# Patient Record
Sex: Female | Born: 1945 | Race: White | Hispanic: No | Smoking: Never smoker
Health system: Southern US, Community
[De-identification: ages and names within clinical notes are randomized; demographics above are authoritative.]

## PROBLEM LIST (undated history)

## (undated) DIAGNOSIS — R413 Other amnesia: Secondary | ICD-10-CM

## (undated) DIAGNOSIS — K5792 Diverticulitis of intestine, part unspecified, without perforation or abscess without bleeding: Secondary | ICD-10-CM

## (undated) HISTORY — DX: Other amnesia: R41.3

## (undated) HISTORY — DX: Diverticulitis of intestine, part unspecified, without perforation or abscess without bleeding: K57.92

---

## 1988-07-30 HISTORY — PX: ABDOMINAL HYSTERECTOMY: SHX81

## 2000-02-01 ENCOUNTER — Other Ambulatory Visit: Admission: RE | Admit: 2000-02-01 | Discharge: 2000-02-01 | Payer: Self-pay | Admitting: Obstetrics and Gynecology

## 2001-02-25 ENCOUNTER — Encounter (INDEPENDENT_AMBULATORY_CARE_PROVIDER_SITE_OTHER): Payer: Self-pay | Admitting: *Deleted

## 2001-02-25 ENCOUNTER — Ambulatory Visit (HOSPITAL_BASED_OUTPATIENT_CLINIC_OR_DEPARTMENT_OTHER): Admission: RE | Admit: 2001-02-25 | Discharge: 2001-02-25 | Payer: Self-pay | Admitting: Plastic Surgery

## 2007-01-07 ENCOUNTER — Other Ambulatory Visit: Admission: RE | Admit: 2007-01-07 | Discharge: 2007-01-07 | Payer: Self-pay | Admitting: Obstetrics & Gynecology

## 2007-04-24 ENCOUNTER — Observation Stay (HOSPITAL_COMMUNITY): Admission: EM | Admit: 2007-04-24 | Discharge: 2007-04-25 | Payer: Self-pay | Admitting: Emergency Medicine

## 2007-04-24 IMAGING — CT CT HEAD W/O CM
1 of 2 series · 16 of 30 positions shown, 20 images · IV contrast (agent unspecified)
Comparison: None.

CLINICAL DATA: Altered level of consciousness. 
 HEAD CT WITHOUT CONTRAST:
TECHNIQUE: Contiguous axial images were obtained from the base of the skull through the vertex 
 according to standard protocol without contrast.

[Series 2: head routine 4.8 h47s · axial · 0.39mm/px · z∈[-154,-30]mm · 16 of 30 slices shown, 20 images]
[im 2/30  brain]
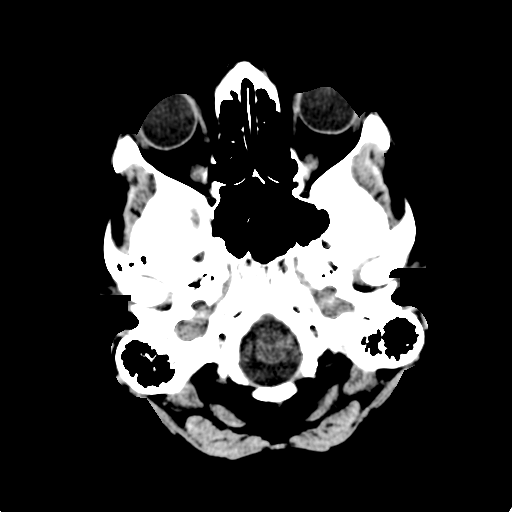
[im 2/30  bone]
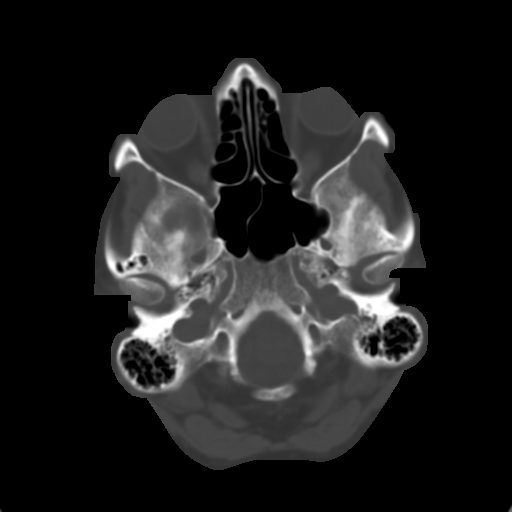
[im 4/30  brain]
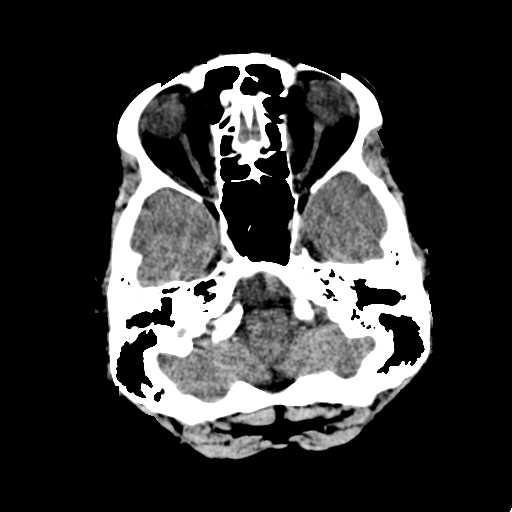
[im 5/30  brain]
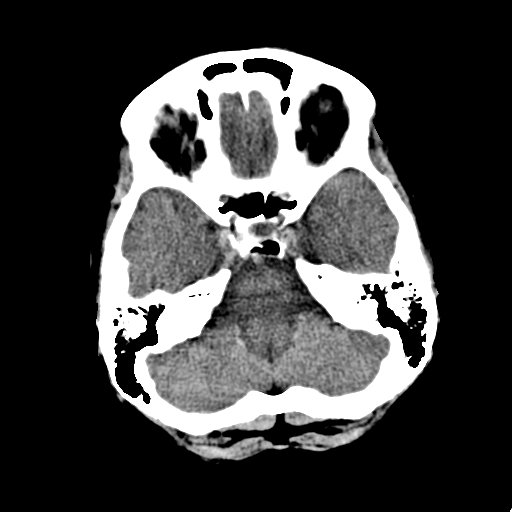
[im 7/30  brain]
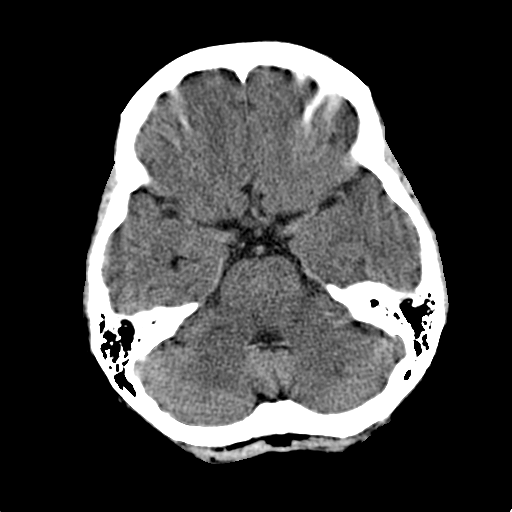
[im 9/30  brain]
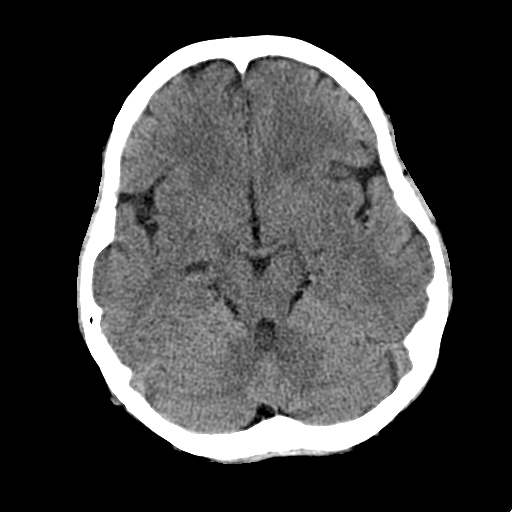
[im 9/30  bone]
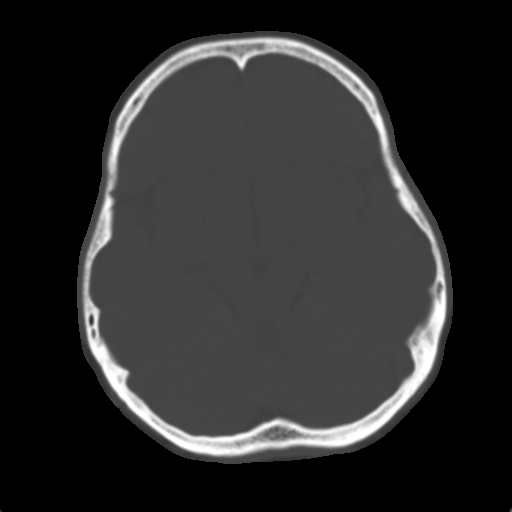
[im 10/30  brain]
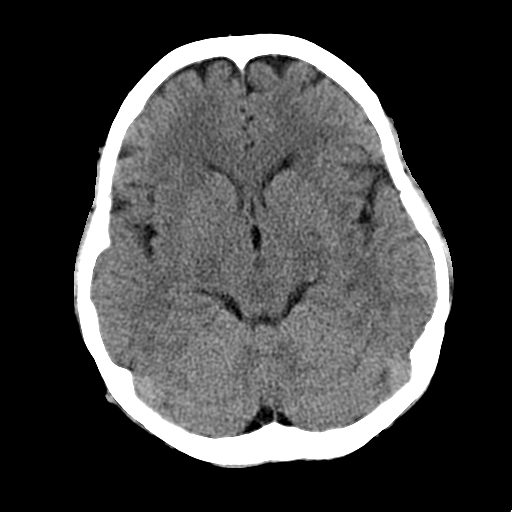
[im 12/30  brain]
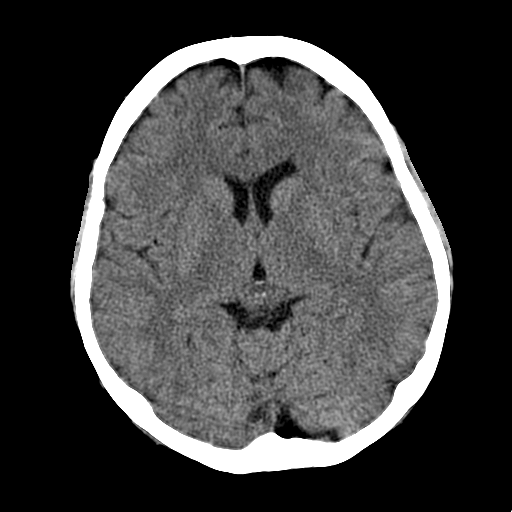
[im 13/30  brain]
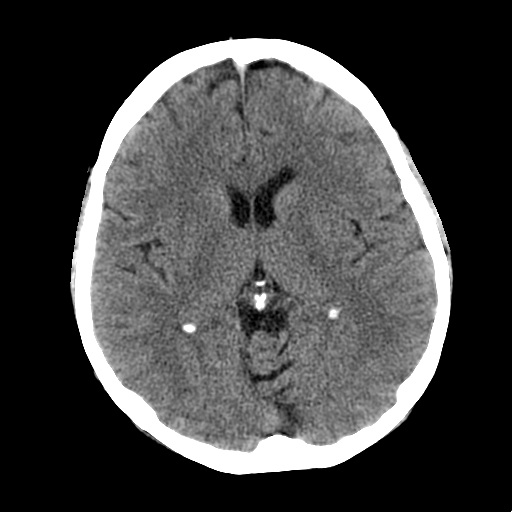
[im 17/30  brain]
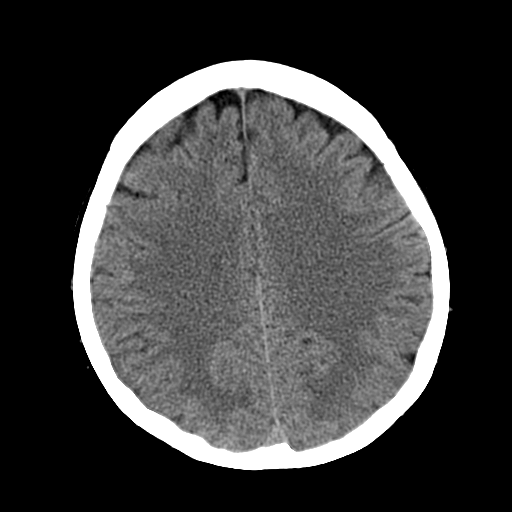
[im 17/30  bone]
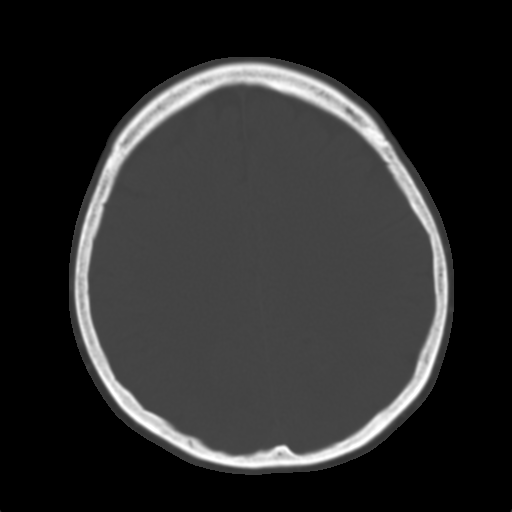
[im 18/30  brain]
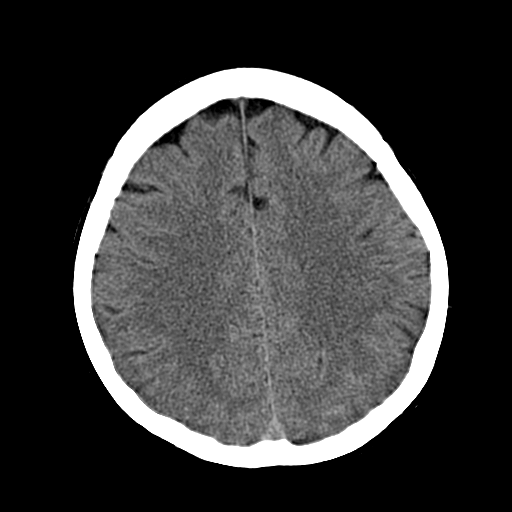
[im 20/30  brain]
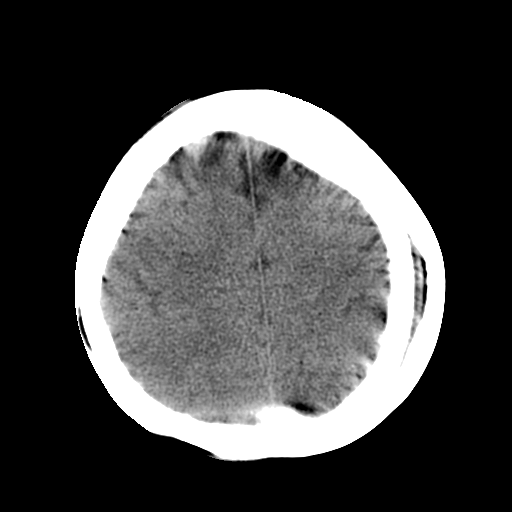
[im 21/30  brain]
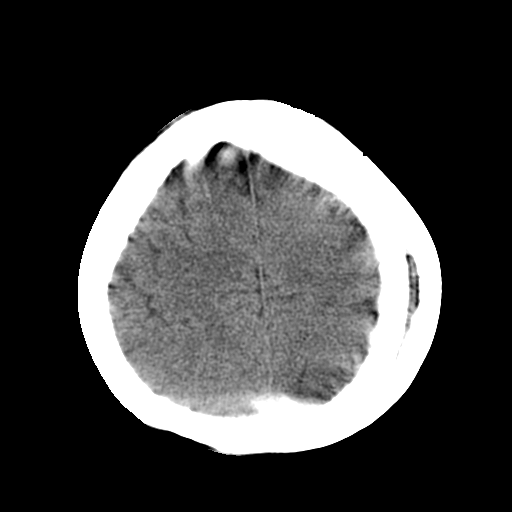
[im 23/30  brain]
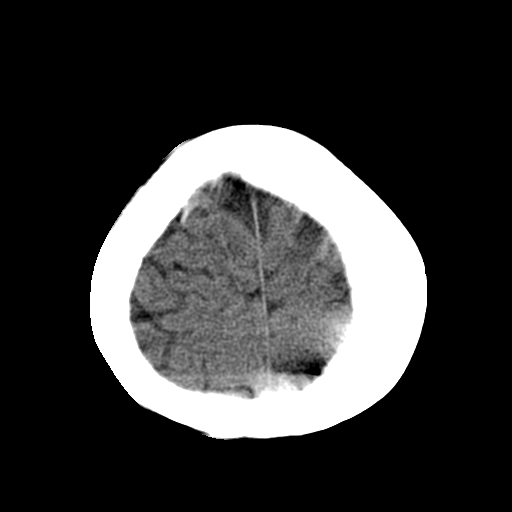
[im 23/30  bone]
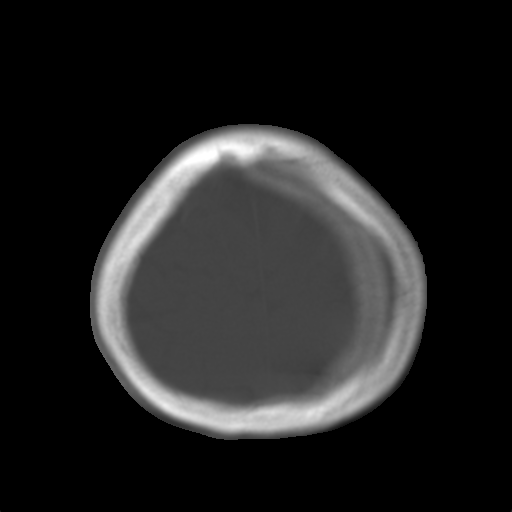
[im 25/30  brain]
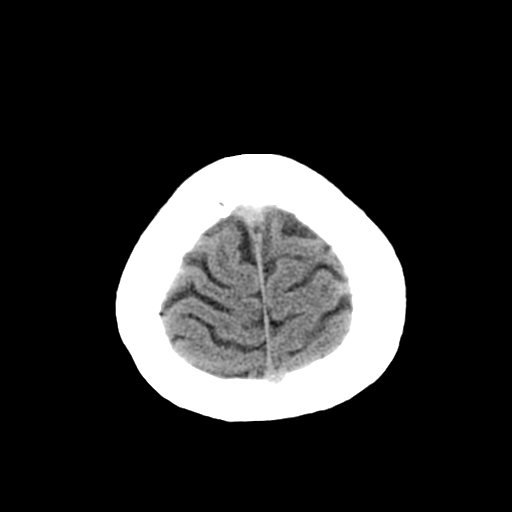
[im 26/30  brain]
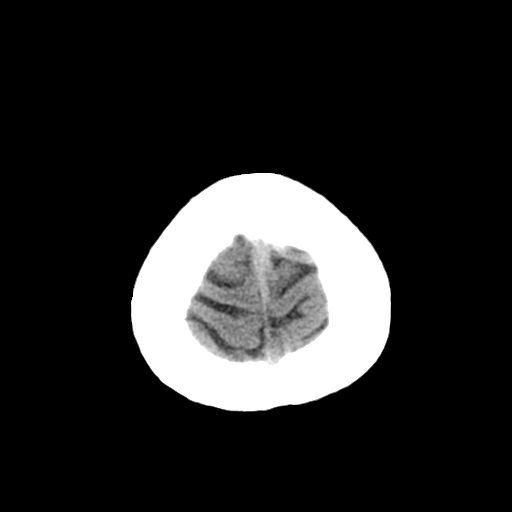
[im 28/30  brain]
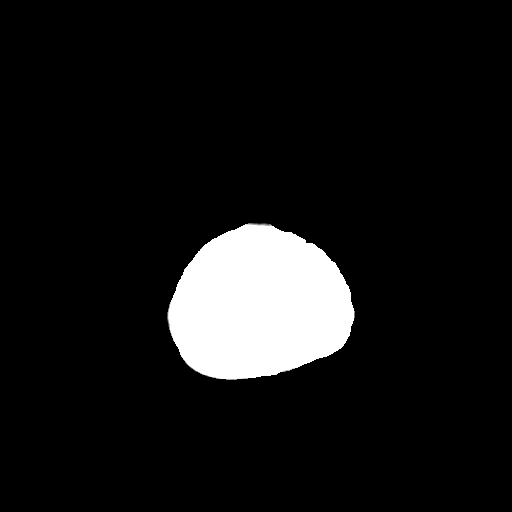

[16 of 30 positions shown; findings below may reference images not displayed]

FINDINGS: There is mild chronic microvascular ischemic change.  No evidence of intracranial abnormality including hemorrhage, infarct, mass, mass effect, midline shift or abnormal fluid collection.  There is no hydrocephalus.  The imaged paranasal sinuses and mastoid air cells are clear.
IMPRESSION: No acute intracranial abnormality.

## 2010-12-12 NOTE — H&P (Signed)
NAME:  Sally Mosley, Sally Mosley NO.:  [id number]   MEDICAL RECORD NO.:  [id number]          PATIENT TYPE:  EMS   LOCATION:  MAJO                         FACILITY:  [facility]   PHYSICIAN:  Larina Earthly, M.D.        DATE OF BIRTH:  01/01/1946   DATE OF ADMISSION:  04/24/2007  DATE OF DISCHARGE:                              HISTORY & PHYSICAL   CHIEF COMPLAINT:  Altered mental status with short-term memory loss at  [facility].   HISTORY OF PRESENT ILLNESS:  This is a 65 year old Caucasian female who  is relatively healthy with a past medical history significant for  hypertension, hyperlipidemia and osteopenia, who was at [facility]  this afternoon for the birth of her grandchild.  The birth was  complicated by a trapped umbilical cord, requiring emergency C-section.  The patient was found by her son-in-law after the birth of the  grandchild in the operating waiting room, in no apparent distress,  sitting in a chair but clearly with altered mental status, unable to  recognize the son-in-law, any other family or the situation.  She was  disoriented to place and time, as well.  EMS was called who assessed her  vital signs, which were normal.  She was found be afebrile.  Her oxygen  saturation was stable and normal on room air, and her glucose was  normal.  She was transported to [facility] emergency room, for further  evaluation and management.   In the emergency room, labs, drug screen, CT of the head, chest x-ray  and urinalysis were all within normal limits.  Cardiac enzymes were  normal.  Neurological and physical exam are all unremarkable, but the  patient continued to have disorientation to place and situation as well  as time and was unable to come up with appropriate words on occasion,  per the emergency room physician.  Upon my arrival, the patient was  slowly regaining her memory, specifically with a return to long-term.  She was able to slowly tell me what her  medical problems were, the fact  that she had 3 children and 4 and now 5 grandchildren and could tell me  that it was September 2008 but could not tell me the date or the day of  the week.  She could not name her medications at least initially, but  slowly came back with them over the next 5 to 10 minutes.  Clearly,  things were improving per the family's report, who were present during  the last 3 to 4 hours.  Again, in questioning both the family as well as  the patient, there is no history of seizure activity or seizure disorder  and no history of CNS abnormalities.  They did mention that stress may  be playing a role in the situation, given the fact that the patient's  third pregnancy was complicated by stillbirth at 9 months.  The patient  will now be admitted for further observation and possible MRI.  The  latter to look for any anatomic abnormalities missed by the head CT, as  well as  to entertain the differential diagnosis of possible seizure-like  activity.   REVIEW OF SYSTEMS:  Questionable for transient mild headache.   PROBLEM LIST:  1. Osteopenia.  2. Hypertension.  3. Hyperlipidemia.   MEDICATIONS:  As can best be ascertained, include benazepril. Evista,  Zocor, aspirin, vitamin D.   ALLERGIES:  SHE HAS NO KNOWN DRUG ALLERGIES.   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   FAMILY HISTORY:  Significant for hypertension, diabetes, but no seizure  or seizure activity or evidence of early strokes or heart disease.   LABS:  White blood cell count 8.4, hemoglobin 13.9, hematocrit 41.2%,  platelet count 231, sodium 138, potassium 3.7, carbon dioxide 23,  glucose 92, BUN 9, creatinine 0.73, calcium 9.1, total protein 6.7,  albumin 4.0, AST 28, ALT 29, alkaline phosphatase apparently normal,  alcohol level less than 5.  Urinalysis unremarkable for infection.   Drugs of abuse screen was negative.  Head CT revealed no acute  abnormality, and chest x-ray revealed no acute abnormality.   PHYSICAL EXAM:  GENERAL:  We have a pleasant, laughing Caucasian female,  lying in bed flat.  No apparent distress, questionably suspicious for  denial of the earlier events, and again she kept on asking how long she  had been in the emergency room.  She was alert to person, to the fact  that she was in the hospital but could not name the hospital, but could  name the hospitals were present in [place], and she was also alert to  month and year.  VITAL SIGNS:  Temperature is 97.7 degrees Fahrenheit, blood pressure  149/90, respirations 20, pulse 78 and regular.  Saturation 100% on room  air.  HEENT:  Sclerae anicteric.  Extraocular movements were intact.  The  pupils were equal and reactive to light and accommodation.  Face is  symmetric.  There is no oropharyngeal lesions.  There is no cervical  lymphadenopathy.  There is no axillary lymphadenopathy.  Tympanic  membranes were clear bilaterally.  LUNGS:  Clear to auscultation bilaterally.  CARDIOVASCULAR:  Regular rate and rhythm with no murmurs, rubs or  gallops appreciated.  ABDOMEN:  Soft, nontender, nondistended.  Bowel sounds are present.  There is hepatosplenomegaly.  There is no peripheral edema.  Pedal  pulses and radial pulses were intact and symmetrical.  NEUROLOGICAL:  Exam was abnormal for the disorientation, but cranial  nerves II-XII are grossly intact.  Strength was 5/5 in all 4  extremities.  DTRs were symmetrical.  Light touch was grossly intact in  all four extremities.  Gait was not tested.   ASSESSMENT PLAN:  1. Altered mental status with short-term memory loss and      disorientation of unclear etiology.  Differential diagnosis      includes possible postictal seizure activity, stress disorder, or      psychosomatic disorder secondary to the stressors involved with the      birth of her  grandchild, doubt stroke given the nonfocal      neurological exam and doubt metabolic toxicity given normal labs      and electrolytes, as well as negative tox screen.  Will admit the      patient for 23-hour observation, check a brain MRI to rule out mass      effect and/or acute stroke, and if the patient has recurrent  symptoms consider EEG to rule out seizure-like activity.  Will plan      on discharging on April 25, 2007 for further outpatient      followup, if events remained benign and MRI is unremarkable.  2. Hypertension.  Will continue ACE inhibitor.  3. Hyperlipidemia.  Will continue Statins.      Larina Earthly, M.D.  Electronically Signed     RA/MEDQ  D:  04/24/2007  T:  04/25/2007  Job:  [id number]   cc:   Gaspar Garbe, M.D.

## 2010-12-15 NOTE — Op Note (Signed)
[place]. [facility]  Patient:    KEYONNI, PERCIVAL                      MRN: [id number] Proc. Date: 02/25/01 Adm. Date:  [id number] Disc. Date: [id number] Attending:  Loura Halt Ii CC:         Hope M. Danella Deis, M.D.   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  OPERATION PERFORMED:  Excision of squamous cell carcinoma left popliteal area with primary closure.  SURGEON:  Alfredia Ferguson, M.D.  ANESTHESIA:  2% Xylocaine 1:100,000 epinephrine.  INDICATIONS FOR PROCEDURE:  The patient is a 65 year old woman who underwent a biopsy of a lesion in her left popliteal area by Dr. Campbell Stall.  The biopsy returned squamous cell carcinoma.  The patient returns today for a wider excision of the biopsy site.  She understands the risks of positive margins, infection, unsightly scarring and the possibility that all tumor has been resolved with the biopsy.  In spite of these and other risks discussed, she wishes to proceed with the surgery.  DESCRIPTION OF PROCEDURE:  After the patient was placed in the prone position, skin markers were placed in elliptical fashion around the lesion.  Local anesthesia was infiltrated.  The area was prepped and draped in sterile fashion.  An elliptical excision transversely oriented was carried out down to the level of subcutaneous tissues.  The lesion was passed off for pathology. Wound edges were undermined for a distance of several millimeters in all directions.  Hemostasis was accomplished using pressure.  The wound was closed by approximating the dermis using interrupted 4-0 Vicryl suture.  The skin was united using a running 5-0 nylon suture.  The patient tolerated the procedure well with minimal blood loss.  A light dressing was applied.  The patient  was discharged home in satisfactory condition. DD:  03/05/01 TD:  03/05/01 Job: [id number]

## 2011-05-10 LAB — COMPREHENSIVE METABOLIC PANEL
ALT: 29
AST: 28
Alkaline Phosphatase: 56
CO2: 23
Calcium: 9.1
Chloride: 106
GFR calc Af Amer: >60
GFR calc non Af Amer: >60
Potassium: 3.7
Sodium: 138
Total Bilirubin: 0.9

## 2011-05-10 LAB — ETHANOL: Alcohol, Ethyl (B): <5

## 2011-05-10 LAB — DIFFERENTIAL
Basophils Relative: 0
Eosinophils Absolute: 0.1
Eosinophils Relative: 1
Lymphs Abs: 1.3

## 2011-05-10 LAB — CULTURE, BLOOD (ROUTINE X 2): Culture: NO GROWTH

## 2011-05-10 LAB — URINE MICROSCOPIC-ADD ON

## 2011-05-10 LAB — URINALYSIS, ROUTINE W REFLEX MICROSCOPIC
Glucose, UA: NEGATIVE
Ketones, ur: 15 — AB
Leukocytes, UA: NEGATIVE
pH: 6

## 2011-05-10 LAB — CBC
Hemoglobin: 13.9
RBC: 4.56
WBC: 8.4

## 2011-05-10 LAB — POCT CARDIAC MARKERS: Operator id: 270111

## 2013-03-04 ENCOUNTER — Ambulatory Visit: Payer: Self-pay | Admitting: Internal Medicine

## 2017-05-28 ENCOUNTER — Other Ambulatory Visit: Payer: Self-pay | Admitting: Internal Medicine

## 2017-05-28 DIAGNOSIS — K5730 Diverticulosis of large intestine without perforation or abscess without bleeding: Secondary | ICD-10-CM

## 2017-05-28 DIAGNOSIS — R1032 Left lower quadrant pain: Secondary | ICD-10-CM

## 2017-05-31 ENCOUNTER — Ambulatory Visit
Admission: RE | Admit: 2017-05-31 | Discharge: 2017-05-31 | Disposition: A | Discharge status: Home / Self Care | Payer: Medicare Other | Source: Ambulatory Visit | Attending: Internal Medicine | Admitting: Internal Medicine

## 2017-05-31 DIAGNOSIS — K5730 Diverticulosis of large intestine without perforation or abscess without bleeding: Secondary | ICD-10-CM

## 2017-05-31 DIAGNOSIS — R1032 Left lower quadrant pain: Secondary | ICD-10-CM

## 2017-05-31 MED ORDER — IOPAMIDOL (ISOVUE-300) INJECTION 61%
100.0000 mL | Freq: Once | INTRAVENOUS | Status: AC | PRN
  Administered 2017-05-31: 100 mL via INTRAVENOUS

## 2018-05-05 DIAGNOSIS — Z86010 Personal history of colonic polyps: Secondary | ICD-10-CM | POA: Insufficient documentation

## 2018-09-12 ENCOUNTER — Ambulatory Visit: Payer: Medicare Other | Admitting: Allergy

## 2018-09-12 ENCOUNTER — Encounter: Payer: Self-pay | Admitting: Allergy

## 2018-09-12 VITALS — BP 110/80 | HR 123 | Resp 16 | Ht 59.0 in | Wt 129.4 lb

## 2018-09-12 DIAGNOSIS — T50905D Adverse effect of unspecified drugs, medicaments and biological substances, subsequent encounter: Secondary | ICD-10-CM | POA: Diagnosis not present

## 2018-09-12 NOTE — Progress Notes (Signed)
New Patient Note  RE: Sally Mosley MRN: [id number] DOB: July 15, 1946 Date of Office Visit: 09/12/2018  Referring provider: No ref. provider found Primary care provider: Tisovec, Adelfa Koh, MD  Chief Complaint: drug allergy  History of present illness: Sally Mosley is a 73 y.o. female presenting today for evaluation of drug allergy.    She states she wants to find out if she is allergic to PCN and sulfa drugs.  She states she has a history of diverticulitis and had a flare recently and needed to be treated with 2 different antibiotics.  She states Augmentin was a preferred option however she carried a PCN allergy label thus was not able to take this.       She states she doesn't remember having a reaction to PCN or if she ever took it.  She has been avoiding PCNs since childhood.    She vaguely remembers the sulfa reaction as a child.  She believes she may have developed an itchy rash but is unsure when rash started during the course.  She also does not recall having any blistering or skin sloughing or mucosal involvement.    She denies any history of asthma, eczema, food allergy or allergic rhinitis/conjunctivitis.    Review of systems: Review of Systems  Constitutional: Negative for chills, fever and malaise/fatigue.  HENT: Negative for congestion, ear discharge, nosebleeds and sore throat.   Eyes: Negative for pain, discharge and redness.  Respiratory: Negative for cough, shortness of breath and wheezing.   Cardiovascular: Negative for chest pain.  Gastrointestinal: Negative for abdominal pain, constipation, diarrhea, heartburn, nausea and vomiting.  Musculoskeletal: Negative for joint pain.  Skin: Negative for itching and rash.  Neurological: Negative for headaches.    All other systems negative unless noted above in HPI  Past medical history: Past Medical History:  Diagnosis Date  . Amnesia memory loss   . Diverticulitis     Past surgical  history: Past Surgical History:  Procedure Laterality Date  . ABDOMINAL HYSTERECTOMY  1990    Family history:  Family History  Problem Relation Age of Onset  . Diabetes Mellitus II Sister     Social history: Lives ina home with carpeting with gas heating and central cooling.  Dog in the home.  No concern for water damage, mildew or roaches in the home.  She is a retired Runner, broadcasting/film/video.  No smoking history.   Medication List: Allergies as of 09/12/2018      Reactions   Sulfamethoxazole Itching      Medication List       Accurate as of September 12, 2018 12:02 PM. Always use your most recent med list.        benazepril 10 MG tablet Commonly known as:  LOTENSIN Take by mouth.   hyoscyamine 0.375 MG 12 hr tablet Commonly known as:  LEVBID   hyoscyamine 0.125 MG SL tablet Commonly known as:  LEVSIN SL Take by mouth.   pravastatin 20 MG tablet Commonly known as:  PRAVACHOL Take by mouth.   Vitamin D3 25 MCG (1000 UT) Caps Take by mouth.       Known medication allergies: Allergies  Allergen Reactions  . Sulfamethoxazole Itching    Physical examination: Blood pressure 110/80, pulse (!) 123, resp. rate 16, height 4\' 11"  (1.499 m), weight 129 lb 6.4 oz (58.7 kg), SpO2 94 %.  General: Alert, interactive, in no acute distress. HEENT: PERRLA, TMs pearly gray, turbinates non-edematous without discharge, post-pharynx unremarkable. Neck: Supple  without lymphadenopathy. Lungs: Clear to auscultation without wheezing, rhonchi or rales. {no increased work of breathing. CV: Normal S1, S2 without murmurs. Abdomen: Nondistended, nontender. Skin: Warm and dry, without lesions or rashes. Extremities:  No clubbing, cyanosis or edema. Neuro:   Grossly intact.  Diagnositics/Labs: Drug challenge to PCN with use of PCN V 250mg /59ml solution. Benefits and risks of challenge discussed and verbal consent from mother obtained.  She was provided with increasing doses of PCN at 1%, 10% and  90% doses of total dose of 500mg .  Doses given 20 minutes apart.  She was observed for additional hour after completion of ingestion challenge.  She had no signs/symptoms of allergic reaction.  Vitals were obtained prior to discharge and remained stable.  Discharge vitals 122/86, HR 89, RR 16.   Assessment and plan:   Adverse drug reaction  - Previous history of Penicillin ingestion/reaction unknown.  Given this decision made to perform in-office challenge to PCN V with total dose of 500mg .  Entirety of the dose was tolerated today and no adverse events occurred.  Thus you are not allergic to Penicillin based medications.  Ok to use Penicillins in future if needed.  We will remove from allergy profile.    - Previous history of itchy rash after sulfa drug use.  This reaction does not sound like a serious drug reaction where continued avoidance would be required.  Thus would recommend in-office challenge to Bactrim be performed in the future to prove you are not allergic.    Follow-up for Bactrim challenge.  Once appt has been scheduled we will fill a prescription for Bactrim to bring to the office.  Hold all antihistamines for at least 3 days prior to challenge visit.    I appreciate the opportunity to take part in Sally Mosley's care. Please do not hesitate to contact me with questions.  Sincerely,   Margo Aye, MD Allergy/Immunology [facility]

## 2018-09-12 NOTE — Patient Instructions (Signed)
Drug allergy   - Previous history of Penicillin ingestion/reaction unknown.  Given this decision made to perform in-office challenge to PCN V with total dose of 500mg .  Entirety of the dose was tolerated today and no adverse events occurred.  Thus you are not allergic to Penicillin based medications.  Ok to use Penicillins in future if needed.  We will remove from allergy profile.    - Previous history of itchy rash after sulfa drug use.  This reaction does not sound like a serious drug reaction where continued avoidance would be required.  Thus would recommend in-office challenge to Bactrim be performed in the future to prove you are not allergic.    Follow-up for Bactrim challenge.  Once appt has been scheduled we will fill a prescription for Bactrim to bring to the office.  Hold all antihistamines for at least 3 days prior to challenge visit.

## 2019-08-29 ENCOUNTER — Ambulatory Visit: Payer: Medicare Other

## 2019-09-04 ENCOUNTER — Ambulatory Visit: Payer: Medicare PPO | Attending: Internal Medicine

## 2019-09-04 DIAGNOSIS — Z23 Encounter for immunization: Secondary | ICD-10-CM

## 2019-09-04 NOTE — Progress Notes (Signed)
° °  Covid-19 Vaccination Clinic  Name:  Sally Mosley    MRN: [id number] DOB: Dec 24, 1945  09/04/2019  [name] was observed post Covid-19 immunization for 15 minutes without incidence. She was provided with Vaccine Information Sheet and instruction to access the V-Safe system.   [name] was instructed to call 911 with any severe reactions post vaccine:  Difficulty breathing   Swelling of your face and throat   A fast heartbeat   A bad rash all over your body   Dizziness and weakness    Immunizations Administered    Name Date Dose VIS Date Route   Pfizer COVID-19 Vaccine 09/04/2019  9:03 AM 0.3 mL 07/10/2019 Intramuscular   Manufacturer: [employer], [employer]   Lot: [id number]   NDC: [id number]

## 2019-09-09 ENCOUNTER — Ambulatory Visit: Payer: Medicare Other

## 2019-09-29 ENCOUNTER — Ambulatory Visit: Payer: Medicare PPO | Attending: Internal Medicine

## 2019-09-29 DIAGNOSIS — Z23 Encounter for immunization: Secondary | ICD-10-CM | POA: Insufficient documentation

## 2019-09-29 NOTE — Progress Notes (Signed)
   Covid-19 Vaccination Clinic  Name:  Sally Mosley    MRN: [id number] DOB: 1946/02/06  09/29/2019  [name] was observed post Covid-19 immunization for 15 minutes without incident. She was provided with Vaccine Information Sheet and instruction to access the V-Safe system.   [name] was instructed to call 911 with any severe reactions post vaccine: Marland Kitchen Difficulty breathing  . Swelling of face and throat  . A fast heartbeat  . A bad rash all over body  . Dizziness and weakness   Immunizations Administered    Name Date Dose VIS Date Route   Pfizer COVID-19 Vaccine 09/29/2019  9:33 AM 0.3 mL 07/10/2019 Intramuscular   Manufacturer: [employer], [employer]   Lot: [id number]   NDC: [address]-1

## 2019-12-08 DIAGNOSIS — D692 Other nonthrombocytopenic purpura: Secondary | ICD-10-CM | POA: Diagnosis not present

## 2019-12-08 DIAGNOSIS — D1801 Hemangioma of skin and subcutaneous tissue: Secondary | ICD-10-CM | POA: Diagnosis not present

## 2019-12-08 DIAGNOSIS — L820 Inflamed seborrheic keratosis: Secondary | ICD-10-CM | POA: Diagnosis not present

## 2019-12-08 DIAGNOSIS — L821 Other seborrheic keratosis: Secondary | ICD-10-CM | POA: Diagnosis not present

## 2019-12-08 DIAGNOSIS — Z85828 Personal history of other malignant neoplasm of skin: Secondary | ICD-10-CM | POA: Diagnosis not present

## 2019-12-08 DIAGNOSIS — L723 Sebaceous cyst: Secondary | ICD-10-CM | POA: Diagnosis not present

## 2020-02-09 DIAGNOSIS — H2513 Age-related nuclear cataract, bilateral: Secondary | ICD-10-CM | POA: Diagnosis not present

## 2020-02-09 DIAGNOSIS — H43813 Vitreous degeneration, bilateral: Secondary | ICD-10-CM | POA: Diagnosis not present

## 2020-02-09 DIAGNOSIS — H5010 Unspecified exotropia: Secondary | ICD-10-CM | POA: Diagnosis not present

## 2020-02-09 DIAGNOSIS — H04123 Dry eye syndrome of bilateral lacrimal glands: Secondary | ICD-10-CM | POA: Diagnosis not present

## 2020-02-19 DIAGNOSIS — Z0000 Encounter for general adult medical examination without abnormal findings: Secondary | ICD-10-CM | POA: Diagnosis not present

## 2020-02-19 DIAGNOSIS — E7800 Pure hypercholesterolemia, unspecified: Secondary | ICD-10-CM | POA: Diagnosis not present

## 2020-02-19 DIAGNOSIS — M859 Disorder of bone density and structure, unspecified: Secondary | ICD-10-CM | POA: Diagnosis not present

## 2020-02-19 DIAGNOSIS — R7301 Impaired fasting glucose: Secondary | ICD-10-CM | POA: Diagnosis not present

## 2020-02-19 DIAGNOSIS — I10 Essential (primary) hypertension: Secondary | ICD-10-CM | POA: Diagnosis not present

## 2020-02-26 DIAGNOSIS — D126 Benign neoplasm of colon, unspecified: Secondary | ICD-10-CM | POA: Diagnosis not present

## 2020-02-26 DIAGNOSIS — E7800 Pure hypercholesterolemia, unspecified: Secondary | ICD-10-CM | POA: Diagnosis not present

## 2020-02-26 DIAGNOSIS — I10 Essential (primary) hypertension: Secondary | ICD-10-CM | POA: Diagnosis not present

## 2020-02-26 DIAGNOSIS — J309 Allergic rhinitis, unspecified: Secondary | ICD-10-CM | POA: Diagnosis not present

## 2020-02-26 DIAGNOSIS — Z1331 Encounter for screening for depression: Secondary | ICD-10-CM | POA: Diagnosis not present

## 2020-02-26 DIAGNOSIS — R82998 Other abnormal findings in urine: Secondary | ICD-10-CM | POA: Diagnosis not present

## 2020-02-26 DIAGNOSIS — Z0000 Encounter for general adult medical examination without abnormal findings: Secondary | ICD-10-CM | POA: Diagnosis not present

## 2020-02-26 DIAGNOSIS — D692 Other nonthrombocytopenic purpura: Secondary | ICD-10-CM | POA: Diagnosis not present

## 2020-02-26 DIAGNOSIS — R7301 Impaired fasting glucose: Secondary | ICD-10-CM | POA: Diagnosis not present

## 2020-02-26 DIAGNOSIS — M8580 Other specified disorders of bone density and structure, unspecified site: Secondary | ICD-10-CM | POA: Diagnosis not present

## 2020-03-03 DIAGNOSIS — Z1212 Encounter for screening for malignant neoplasm of rectum: Secondary | ICD-10-CM | POA: Diagnosis not present

## 2020-04-12 DIAGNOSIS — H43813 Vitreous degeneration, bilateral: Secondary | ICD-10-CM | POA: Diagnosis not present

## 2020-04-12 DIAGNOSIS — H2513 Age-related nuclear cataract, bilateral: Secondary | ICD-10-CM | POA: Diagnosis not present

## 2020-04-12 DIAGNOSIS — H40023 Open angle with borderline findings, high risk, bilateral: Secondary | ICD-10-CM | POA: Diagnosis not present

## 2020-04-12 DIAGNOSIS — H04201 Unspecified epiphora, right lacrimal gland: Secondary | ICD-10-CM | POA: Diagnosis not present

## 2020-04-12 DIAGNOSIS — H5010 Unspecified exotropia: Secondary | ICD-10-CM | POA: Diagnosis not present

## 2020-05-28 DIAGNOSIS — Z23 Encounter for immunization: Secondary | ICD-10-CM | POA: Diagnosis not present

## 2020-08-23 DIAGNOSIS — Z1231 Encounter for screening mammogram for malignant neoplasm of breast: Secondary | ICD-10-CM | POA: Diagnosis not present

## 2020-10-17 DIAGNOSIS — R7301 Impaired fasting glucose: Secondary | ICD-10-CM | POA: Diagnosis not present

## 2020-10-17 DIAGNOSIS — M542 Cervicalgia: Secondary | ICD-10-CM | POA: Diagnosis not present

## 2020-10-17 DIAGNOSIS — R5381 Other malaise: Secondary | ICD-10-CM | POA: Diagnosis not present

## 2020-10-17 DIAGNOSIS — I10 Essential (primary) hypertension: Secondary | ICD-10-CM | POA: Diagnosis not present

## 2020-12-21 DIAGNOSIS — L570 Actinic keratosis: Secondary | ICD-10-CM | POA: Diagnosis not present

## 2020-12-21 DIAGNOSIS — L821 Other seborrheic keratosis: Secondary | ICD-10-CM | POA: Diagnosis not present

## 2020-12-21 DIAGNOSIS — L438 Other lichen planus: Secondary | ICD-10-CM | POA: Diagnosis not present

## 2021-02-24 DIAGNOSIS — H353131 Nonexudative age-related macular degeneration, bilateral, early dry stage: Secondary | ICD-10-CM | POA: Diagnosis not present

## 2021-02-24 DIAGNOSIS — H43813 Vitreous degeneration, bilateral: Secondary | ICD-10-CM | POA: Diagnosis not present

## 2021-02-24 DIAGNOSIS — H04201 Unspecified epiphora, right lacrimal gland: Secondary | ICD-10-CM | POA: Diagnosis not present

## 2021-02-24 DIAGNOSIS — H40023 Open angle with borderline findings, high risk, bilateral: Secondary | ICD-10-CM | POA: Diagnosis not present

## 2021-02-24 DIAGNOSIS — H2513 Age-related nuclear cataract, bilateral: Secondary | ICD-10-CM | POA: Diagnosis not present

## 2021-02-27 DIAGNOSIS — R7301 Impaired fasting glucose: Secondary | ICD-10-CM | POA: Diagnosis not present

## 2021-02-27 DIAGNOSIS — M859 Disorder of bone density and structure, unspecified: Secondary | ICD-10-CM | POA: Diagnosis not present

## 2021-02-27 DIAGNOSIS — E7800 Pure hypercholesterolemia, unspecified: Secondary | ICD-10-CM | POA: Diagnosis not present

## 2021-03-01 DIAGNOSIS — M8580 Other specified disorders of bone density and structure, unspecified site: Secondary | ICD-10-CM | POA: Diagnosis not present

## 2021-03-01 DIAGNOSIS — D692 Other nonthrombocytopenic purpura: Secondary | ICD-10-CM | POA: Diagnosis not present

## 2021-03-01 DIAGNOSIS — K648 Other hemorrhoids: Secondary | ICD-10-CM | POA: Diagnosis not present

## 2021-03-01 DIAGNOSIS — R82998 Other abnormal findings in urine: Secondary | ICD-10-CM | POA: Diagnosis not present

## 2021-03-01 DIAGNOSIS — B351 Tinea unguium: Secondary | ICD-10-CM | POA: Diagnosis not present

## 2021-03-01 DIAGNOSIS — R7301 Impaired fasting glucose: Secondary | ICD-10-CM | POA: Diagnosis not present

## 2021-03-01 DIAGNOSIS — I10 Essential (primary) hypertension: Secondary | ICD-10-CM | POA: Diagnosis not present

## 2021-03-01 DIAGNOSIS — Z0000 Encounter for general adult medical examination without abnormal findings: Secondary | ICD-10-CM | POA: Diagnosis not present

## 2021-03-01 DIAGNOSIS — E7800 Pure hypercholesterolemia, unspecified: Secondary | ICD-10-CM | POA: Diagnosis not present

## 2021-03-01 DIAGNOSIS — Z1331 Encounter for screening for depression: Secondary | ICD-10-CM | POA: Diagnosis not present

## 2021-03-03 DIAGNOSIS — H04562 Stenosis of left lacrimal punctum: Secondary | ICD-10-CM | POA: Diagnosis not present

## 2021-03-03 DIAGNOSIS — H04223 Epiphora due to insufficient drainage, bilateral lacrimal glands: Secondary | ICD-10-CM | POA: Diagnosis not present

## 2021-03-03 DIAGNOSIS — H02831 Dermatochalasis of right upper eyelid: Secondary | ICD-10-CM | POA: Diagnosis not present

## 2021-03-03 DIAGNOSIS — H57813 Brow ptosis, bilateral: Secondary | ICD-10-CM | POA: Diagnosis not present

## 2021-03-03 DIAGNOSIS — H02834 Dermatochalasis of left upper eyelid: Secondary | ICD-10-CM | POA: Diagnosis not present

## 2021-03-03 DIAGNOSIS — H04553 Acquired stenosis of bilateral nasolacrimal duct: Secondary | ICD-10-CM | POA: Diagnosis not present

## 2021-03-23 DIAGNOSIS — M8589 Other specified disorders of bone density and structure, multiple sites: Secondary | ICD-10-CM | POA: Diagnosis not present

## 2021-05-02 DIAGNOSIS — H04411 Chronic dacryocystitis of right lacrimal passage: Secondary | ICD-10-CM | POA: Diagnosis not present

## 2021-05-02 DIAGNOSIS — H04221 Epiphora due to insufficient drainage, right lacrimal gland: Secondary | ICD-10-CM | POA: Diagnosis not present

## 2021-05-02 DIAGNOSIS — H04223 Epiphora due to insufficient drainage, bilateral lacrimal glands: Secondary | ICD-10-CM | POA: Diagnosis not present

## 2021-05-02 DIAGNOSIS — H57813 Brow ptosis, bilateral: Secondary | ICD-10-CM | POA: Diagnosis not present

## 2021-05-02 DIAGNOSIS — H02831 Dermatochalasis of right upper eyelid: Secondary | ICD-10-CM | POA: Diagnosis not present

## 2021-05-02 DIAGNOSIS — H04551 Acquired stenosis of right nasolacrimal duct: Secondary | ICD-10-CM | POA: Diagnosis not present

## 2021-05-02 DIAGNOSIS — H04021 Chronic dacryoadenitis, right lacrimal gland: Secondary | ICD-10-CM | POA: Diagnosis not present

## 2021-05-02 DIAGNOSIS — H02834 Dermatochalasis of left upper eyelid: Secondary | ICD-10-CM | POA: Diagnosis not present

## 2021-05-02 DIAGNOSIS — H04561 Stenosis of right lacrimal punctum: Secondary | ICD-10-CM | POA: Diagnosis not present

## 2021-05-02 DIAGNOSIS — H04553 Acquired stenosis of bilateral nasolacrimal duct: Secondary | ICD-10-CM | POA: Diagnosis not present

## 2021-05-02 DIAGNOSIS — H04562 Stenosis of left lacrimal punctum: Secondary | ICD-10-CM | POA: Diagnosis not present

## 2021-05-16 DIAGNOSIS — H57813 Brow ptosis, bilateral: Secondary | ICD-10-CM | POA: Diagnosis not present

## 2021-05-16 DIAGNOSIS — H04562 Stenosis of left lacrimal punctum: Secondary | ICD-10-CM | POA: Diagnosis not present

## 2021-05-16 DIAGNOSIS — H04559 Acquired stenosis of unspecified nasolacrimal duct: Secondary | ICD-10-CM | POA: Diagnosis not present

## 2021-05-16 DIAGNOSIS — H02831 Dermatochalasis of right upper eyelid: Secondary | ICD-10-CM | POA: Diagnosis not present

## 2021-05-16 DIAGNOSIS — H02834 Dermatochalasis of left upper eyelid: Secondary | ICD-10-CM | POA: Diagnosis not present

## 2021-05-16 DIAGNOSIS — H04221 Epiphora due to insufficient drainage, right lacrimal gland: Secondary | ICD-10-CM | POA: Diagnosis not present

## 2021-06-21 DIAGNOSIS — L820 Inflamed seborrheic keratosis: Secondary | ICD-10-CM | POA: Diagnosis not present

## 2021-06-21 DIAGNOSIS — D692 Other nonthrombocytopenic purpura: Secondary | ICD-10-CM | POA: Diagnosis not present

## 2021-06-21 DIAGNOSIS — L821 Other seborrheic keratosis: Secondary | ICD-10-CM | POA: Diagnosis not present

## 2021-06-21 DIAGNOSIS — L814 Other melanin hyperpigmentation: Secondary | ICD-10-CM | POA: Diagnosis not present

## 2021-06-21 DIAGNOSIS — L720 Epidermal cyst: Secondary | ICD-10-CM | POA: Diagnosis not present

## 2021-06-29 DIAGNOSIS — H02831 Dermatochalasis of right upper eyelid: Secondary | ICD-10-CM | POA: Diagnosis not present

## 2021-06-29 DIAGNOSIS — H04551 Acquired stenosis of right nasolacrimal duct: Secondary | ICD-10-CM | POA: Diagnosis not present

## 2021-06-29 DIAGNOSIS — H04221 Epiphora due to insufficient drainage, right lacrimal gland: Secondary | ICD-10-CM | POA: Diagnosis not present

## 2021-06-29 DIAGNOSIS — H57813 Brow ptosis, bilateral: Secondary | ICD-10-CM | POA: Diagnosis not present

## 2021-06-29 DIAGNOSIS — H04562 Stenosis of left lacrimal punctum: Secondary | ICD-10-CM | POA: Diagnosis not present

## 2021-06-29 DIAGNOSIS — H02834 Dermatochalasis of left upper eyelid: Secondary | ICD-10-CM | POA: Diagnosis not present

## 2021-07-25 DIAGNOSIS — U071 COVID-19: Secondary | ICD-10-CM | POA: Diagnosis not present

## 2021-07-25 DIAGNOSIS — Z1152 Encounter for screening for COVID-19: Secondary | ICD-10-CM | POA: Diagnosis not present

## 2021-07-25 DIAGNOSIS — R0981 Nasal congestion: Secondary | ICD-10-CM | POA: Diagnosis not present

## 2021-07-25 DIAGNOSIS — R051 Acute cough: Secondary | ICD-10-CM | POA: Diagnosis not present

## 2021-07-25 DIAGNOSIS — R509 Fever, unspecified: Secondary | ICD-10-CM | POA: Diagnosis not present

## 2021-07-25 DIAGNOSIS — R5383 Other fatigue: Secondary | ICD-10-CM | POA: Diagnosis not present

## 2021-08-29 DIAGNOSIS — Z1231 Encounter for screening mammogram for malignant neoplasm of breast: Secondary | ICD-10-CM | POA: Diagnosis not present

## 2021-09-07 DIAGNOSIS — K5790 Diverticulosis of intestine, part unspecified, without perforation or abscess without bleeding: Secondary | ICD-10-CM | POA: Diagnosis not present

## 2021-09-07 DIAGNOSIS — K5901 Slow transit constipation: Secondary | ICD-10-CM | POA: Diagnosis not present

## 2021-09-07 DIAGNOSIS — R1032 Left lower quadrant pain: Secondary | ICD-10-CM | POA: Diagnosis not present

## 2021-10-12 DIAGNOSIS — L720 Epidermal cyst: Secondary | ICD-10-CM | POA: Diagnosis not present

## 2021-10-12 DIAGNOSIS — L0889 Other specified local infections of the skin and subcutaneous tissue: Secondary | ICD-10-CM | POA: Diagnosis not present

## 2021-12-26 DIAGNOSIS — H353131 Nonexudative age-related macular degeneration, bilateral, early dry stage: Secondary | ICD-10-CM | POA: Diagnosis not present

## 2021-12-26 DIAGNOSIS — H40023 Open angle with borderline findings, high risk, bilateral: Secondary | ICD-10-CM | POA: Diagnosis not present

## 2021-12-26 DIAGNOSIS — H43813 Vitreous degeneration, bilateral: Secondary | ICD-10-CM | POA: Diagnosis not present

## 2021-12-26 DIAGNOSIS — H2513 Age-related nuclear cataract, bilateral: Secondary | ICD-10-CM | POA: Diagnosis not present

## 2022-01-08 DIAGNOSIS — Z85828 Personal history of other malignant neoplasm of skin: Secondary | ICD-10-CM | POA: Diagnosis not present

## 2022-01-08 DIAGNOSIS — L570 Actinic keratosis: Secondary | ICD-10-CM | POA: Diagnosis not present

## 2022-01-08 DIAGNOSIS — L720 Epidermal cyst: Secondary | ICD-10-CM | POA: Diagnosis not present

## 2022-01-08 DIAGNOSIS — D2271 Melanocytic nevi of right lower limb, including hip: Secondary | ICD-10-CM | POA: Diagnosis not present

## 2022-01-08 DIAGNOSIS — L821 Other seborrheic keratosis: Secondary | ICD-10-CM | POA: Diagnosis not present

## 2022-01-08 DIAGNOSIS — L814 Other melanin hyperpigmentation: Secondary | ICD-10-CM | POA: Diagnosis not present

## 2022-01-08 DIAGNOSIS — D1801 Hemangioma of skin and subcutaneous tissue: Secondary | ICD-10-CM | POA: Diagnosis not present

## 2022-01-08 DIAGNOSIS — D225 Melanocytic nevi of trunk: Secondary | ICD-10-CM | POA: Diagnosis not present

## 2022-01-08 DIAGNOSIS — D2239 Melanocytic nevi of other parts of face: Secondary | ICD-10-CM | POA: Diagnosis not present

## 2022-01-08 DIAGNOSIS — D485 Neoplasm of uncertain behavior of skin: Secondary | ICD-10-CM | POA: Diagnosis not present

## 2022-03-01 DIAGNOSIS — M8580 Other specified disorders of bone density and structure, unspecified site: Secondary | ICD-10-CM | POA: Diagnosis not present

## 2022-03-01 DIAGNOSIS — I10 Essential (primary) hypertension: Secondary | ICD-10-CM | POA: Diagnosis not present

## 2022-03-01 DIAGNOSIS — E7800 Pure hypercholesterolemia, unspecified: Secondary | ICD-10-CM | POA: Diagnosis not present

## 2022-03-01 DIAGNOSIS — R7301 Impaired fasting glucose: Secondary | ICD-10-CM | POA: Diagnosis not present

## 2022-03-01 DIAGNOSIS — R7989 Other specified abnormal findings of blood chemistry: Secondary | ICD-10-CM | POA: Diagnosis not present

## 2022-03-04 DIAGNOSIS — Z0000 Encounter for general adult medical examination without abnormal findings: Secondary | ICD-10-CM | POA: Diagnosis not present

## 2022-03-08 DIAGNOSIS — Z1389 Encounter for screening for other disorder: Secondary | ICD-10-CM | POA: Diagnosis not present

## 2022-03-08 DIAGNOSIS — D692 Other nonthrombocytopenic purpura: Secondary | ICD-10-CM | POA: Diagnosis not present

## 2022-03-08 DIAGNOSIS — R82998 Other abnormal findings in urine: Secondary | ICD-10-CM | POA: Diagnosis not present

## 2022-03-08 DIAGNOSIS — K5730 Diverticulosis of large intestine without perforation or abscess without bleeding: Secondary | ICD-10-CM | POA: Diagnosis not present

## 2022-03-08 DIAGNOSIS — K648 Other hemorrhoids: Secondary | ICD-10-CM | POA: Diagnosis not present

## 2022-03-08 DIAGNOSIS — Z1331 Encounter for screening for depression: Secondary | ICD-10-CM | POA: Diagnosis not present

## 2022-03-08 DIAGNOSIS — R7301 Impaired fasting glucose: Secondary | ICD-10-CM | POA: Diagnosis not present

## 2022-03-08 DIAGNOSIS — M8580 Other specified disorders of bone density and structure, unspecified site: Secondary | ICD-10-CM | POA: Diagnosis not present

## 2022-03-08 DIAGNOSIS — I10 Essential (primary) hypertension: Secondary | ICD-10-CM | POA: Diagnosis not present

## 2022-03-08 DIAGNOSIS — E7800 Pure hypercholesterolemia, unspecified: Secondary | ICD-10-CM | POA: Diagnosis not present

## 2022-03-08 DIAGNOSIS — Z0000 Encounter for general adult medical examination without abnormal findings: Secondary | ICD-10-CM | POA: Diagnosis not present

## 2022-03-08 DIAGNOSIS — B351 Tinea unguium: Secondary | ICD-10-CM | POA: Diagnosis not present

## 2022-05-26 DIAGNOSIS — Z23 Encounter for immunization: Secondary | ICD-10-CM | POA: Diagnosis not present

## 2022-07-03 DIAGNOSIS — H2513 Age-related nuclear cataract, bilateral: Secondary | ICD-10-CM | POA: Diagnosis not present

## 2022-07-03 DIAGNOSIS — H04123 Dry eye syndrome of bilateral lacrimal glands: Secondary | ICD-10-CM | POA: Diagnosis not present

## 2022-07-03 DIAGNOSIS — H353131 Nonexudative age-related macular degeneration, bilateral, early dry stage: Secondary | ICD-10-CM | POA: Diagnosis not present

## 2022-07-03 DIAGNOSIS — H43813 Vitreous degeneration, bilateral: Secondary | ICD-10-CM | POA: Diagnosis not present

## 2022-09-03 ENCOUNTER — Other Ambulatory Visit (HOSPITAL_BASED_OUTPATIENT_CLINIC_OR_DEPARTMENT_OTHER): Payer: Self-pay | Admitting: Registered Nurse

## 2022-09-03 ENCOUNTER — Ambulatory Visit (HOSPITAL_BASED_OUTPATIENT_CLINIC_OR_DEPARTMENT_OTHER)
Admission: RE | Admit: 2022-09-03 | Discharge: 2022-09-03 | Disposition: A | Discharge status: Home / Self Care | Payer: Medicare PPO | Source: Ambulatory Visit | Attending: Registered Nurse | Admitting: Registered Nurse

## 2022-09-03 DIAGNOSIS — N281 Cyst of kidney, acquired: Secondary | ICD-10-CM | POA: Diagnosis not present

## 2022-09-03 DIAGNOSIS — R1032 Left lower quadrant pain: Secondary | ICD-10-CM | POA: Insufficient documentation

## 2022-09-03 DIAGNOSIS — D126 Benign neoplasm of colon, unspecified: Secondary | ICD-10-CM | POA: Diagnosis not present

## 2022-09-03 DIAGNOSIS — R5383 Other fatigue: Secondary | ICD-10-CM | POA: Diagnosis not present

## 2022-09-03 DIAGNOSIS — K5730 Diverticulosis of large intestine without perforation or abscess without bleeding: Secondary | ICD-10-CM | POA: Diagnosis not present

## 2022-09-03 DIAGNOSIS — I10 Essential (primary) hypertension: Secondary | ICD-10-CM | POA: Diagnosis not present

## 2022-09-03 LAB — POCT I-STAT CREATININE: Creatinine, Ser: 0.7 mg/dL (ref 0.44–1.00)

## 2022-09-03 MED ORDER — IOHEXOL 300 MG/ML  SOLN
75.0000 mL | Freq: Once | INTRAMUSCULAR | Status: AC | PRN
  Administered 2022-09-03: 75 mL via INTRAVENOUS

## 2022-09-04 DIAGNOSIS — Z1231 Encounter for screening mammogram for malignant neoplasm of breast: Secondary | ICD-10-CM | POA: Diagnosis not present

## 2022-09-20 DIAGNOSIS — R1032 Left lower quadrant pain: Secondary | ICD-10-CM | POA: Diagnosis not present

## 2022-09-20 DIAGNOSIS — K5730 Diverticulosis of large intestine without perforation or abscess without bleeding: Secondary | ICD-10-CM | POA: Diagnosis not present

## 2022-12-04 ENCOUNTER — Ambulatory Visit: Payer: Medicare PPO | Admitting: Podiatry

## 2022-12-04 ENCOUNTER — Encounter: Payer: Self-pay | Admitting: Podiatry

## 2022-12-04 DIAGNOSIS — L603 Nail dystrophy: Secondary | ICD-10-CM | POA: Diagnosis not present

## 2022-12-04 DIAGNOSIS — B351 Tinea unguium: Secondary | ICD-10-CM | POA: Diagnosis not present

## 2022-12-05 NOTE — Progress Notes (Signed)
  Subjective:  Patient ID: Sally Mosley, female    DOB: 12-29-1945,  MRN: [id number] HPI Chief Complaint  Patient presents with   Nail Problem    Hallux left - toenail discolored and brittle x several months, tried home remedy treatments, it has fallen off before   New Patient (Initial Visit)    77 y.o. female presents with the above complaint.   ROS: Denies fever chills nausea vomit muscle aches pains calf pain back pain chest pain shortness of breath.  Past Medical History:  Diagnosis Date   Amnesia memory loss    Diverticulitis    Past Surgical History:  Procedure Laterality Date   ABDOMINAL HYSTERECTOMY  1990    Current Outpatient Medications:    benazepril (LOTENSIN) 10 MG tablet, Take by mouth., Disp: , Rfl:    Cholecalciferol (VITAMIN D3) 25 MCG (1000 UT) CAPS, Take by mouth., Disp: , Rfl:    pravastatin (PRAVACHOL) 20 MG tablet, Take by mouth., Disp: , Rfl:   Allergies  Allergen Reactions   Sulfamethoxazole Itching   Review of Systems Objective:  There were no vitals filed for this visit.  General: Well developed, nourished, in no acute distress, alert and oriented x3   Dermatological: Skin is warm, dry and supple bilateral. Nails x 10 are thick yellow dystrophic clinically mycotic d; remaining integument appears unremarkable at this time. There are no open sores, no preulcerative lesions, no rash or signs of infection present.  Vascular: Dorsalis Pedis artery and Posterior Tibial artery pedal pulses are 2/4 bilateral with immedate capillary fill time. Pedal hair growth present. No varicosities and no lower extremity edema present bilateral.   Neruologic: Grossly intact via light touch bilateral. Vibratory intact via tuning fork bilateral. Protective threshold with Semmes Wienstein monofilament intact to all pedal sites bilateral. Patellar and Achilles deep tendon reflexes 2+ bilateral. No Babinski or clonus noted bilateral.   Musculoskeletal: No gross  boney pedal deformities bilateral. No pain, crepitus, or limitation noted with foot and ankle range of motion bilateral. Muscular strength 5/5 in all groups tested bilateral.  Gait: Unassisted, Nonantalgic.    Radiographs:  None taken  Assessment & Plan:   Assessment: Nail dystrophy cannot rule out onychomycosis  Plan: Samples of the nails were taken today for pathologic evaluation follow-up with her once those come in     Sally Mosley, [place]

## 2023-01-01 ENCOUNTER — Ambulatory Visit: Payer: Medicare PPO | Admitting: Podiatry

## 2023-01-01 ENCOUNTER — Encounter: Payer: Self-pay | Admitting: Podiatry

## 2023-01-01 DIAGNOSIS — L603 Nail dystrophy: Secondary | ICD-10-CM

## 2023-01-01 NOTE — Progress Notes (Signed)
She presents today for follow-up of her pathology results.  Objective: Vital signs stable alert and oriented x 3.  Objective demonstrates that the nail has gone on to grow out and currently does not demonstrate any signs of fungus.  Her fungal culture did come back positive for yeast as well as saprophytic fungus.  Assessment: Onychomycosis but resolving in the nail.  Plan: At this point we have discussed oral Lamisil therapy.  However I do not feel is necessary for her to take it at this point.  Should she decide that this is starting to recur and she would like to take the medication she she has to do is notify us we will give supply her with 30 days of medication and then have her in for discussion and blood work.

## 2023-03-11 DIAGNOSIS — Z1212 Encounter for screening for malignant neoplasm of rectum: Secondary | ICD-10-CM | POA: Diagnosis not present

## 2023-03-11 DIAGNOSIS — E7800 Pure hypercholesterolemia, unspecified: Secondary | ICD-10-CM | POA: Diagnosis not present

## 2023-03-11 DIAGNOSIS — M8580 Other specified disorders of bone density and structure, unspecified site: Secondary | ICD-10-CM | POA: Diagnosis not present

## 2023-03-11 DIAGNOSIS — E781 Pure hyperglyceridemia: Secondary | ICD-10-CM | POA: Diagnosis not present

## 2023-03-11 DIAGNOSIS — R7301 Impaired fasting glucose: Secondary | ICD-10-CM | POA: Diagnosis not present

## 2023-03-11 DIAGNOSIS — I10 Essential (primary) hypertension: Secondary | ICD-10-CM | POA: Diagnosis not present

## 2023-03-13 DIAGNOSIS — L814 Other melanin hyperpigmentation: Secondary | ICD-10-CM | POA: Diagnosis not present

## 2023-03-13 DIAGNOSIS — D2239 Melanocytic nevi of other parts of face: Secondary | ICD-10-CM | POA: Diagnosis not present

## 2023-03-13 DIAGNOSIS — L821 Other seborrheic keratosis: Secondary | ICD-10-CM | POA: Diagnosis not present

## 2023-03-13 DIAGNOSIS — L720 Epidermal cyst: Secondary | ICD-10-CM | POA: Diagnosis not present

## 2023-03-14 DIAGNOSIS — E781 Pure hyperglyceridemia: Secondary | ICD-10-CM | POA: Diagnosis not present

## 2023-03-14 DIAGNOSIS — Z1212 Encounter for screening for malignant neoplasm of rectum: Secondary | ICD-10-CM | POA: Diagnosis not present

## 2023-05-30 DIAGNOSIS — M8580 Other specified disorders of bone density and structure, unspecified site: Secondary | ICD-10-CM | POA: Diagnosis not present

## 2023-05-30 DIAGNOSIS — D692 Other nonthrombocytopenic purpura: Secondary | ICD-10-CM | POA: Diagnosis not present

## 2023-05-30 DIAGNOSIS — Z1339 Encounter for screening examination for other mental health and behavioral disorders: Secondary | ICD-10-CM | POA: Diagnosis not present

## 2023-05-30 DIAGNOSIS — Z1331 Encounter for screening for depression: Secondary | ICD-10-CM | POA: Diagnosis not present

## 2023-05-30 DIAGNOSIS — I10 Essential (primary) hypertension: Secondary | ICD-10-CM | POA: Diagnosis not present

## 2023-05-30 DIAGNOSIS — J309 Allergic rhinitis, unspecified: Secondary | ICD-10-CM | POA: Diagnosis not present

## 2023-05-30 DIAGNOSIS — B351 Tinea unguium: Secondary | ICD-10-CM | POA: Diagnosis not present

## 2023-05-30 DIAGNOSIS — Z0000 Encounter for general adult medical examination without abnormal findings: Secondary | ICD-10-CM | POA: Diagnosis not present

## 2023-05-30 DIAGNOSIS — E7800 Pure hypercholesterolemia, unspecified: Secondary | ICD-10-CM | POA: Diagnosis not present

## 2023-05-30 DIAGNOSIS — Z23 Encounter for immunization: Secondary | ICD-10-CM | POA: Diagnosis not present

## 2023-05-30 DIAGNOSIS — D126 Benign neoplasm of colon, unspecified: Secondary | ICD-10-CM | POA: Diagnosis not present

## 2023-05-30 DIAGNOSIS — R7301 Impaired fasting glucose: Secondary | ICD-10-CM | POA: Diagnosis not present

## 2023-05-30 DIAGNOSIS — R82998 Other abnormal findings in urine: Secondary | ICD-10-CM | POA: Diagnosis not present

## 2023-09-10 DIAGNOSIS — Z1231 Encounter for screening mammogram for malignant neoplasm of breast: Secondary | ICD-10-CM | POA: Diagnosis not present

## 2024-05-29 DIAGNOSIS — E7849 Other hyperlipidemia: Secondary | ICD-10-CM | POA: Diagnosis not present

## 2024-05-29 DIAGNOSIS — Z1212 Encounter for screening for malignant neoplasm of rectum: Secondary | ICD-10-CM | POA: Diagnosis not present

## 2024-05-29 DIAGNOSIS — E7800 Pure hypercholesterolemia, unspecified: Secondary | ICD-10-CM | POA: Diagnosis not present

## 2024-05-29 DIAGNOSIS — I10 Essential (primary) hypertension: Secondary | ICD-10-CM | POA: Diagnosis not present

## 2024-05-29 DIAGNOSIS — R7301 Impaired fasting glucose: Secondary | ICD-10-CM | POA: Diagnosis not present

## 2024-06-05 DIAGNOSIS — R82998 Other abnormal findings in urine: Secondary | ICD-10-CM | POA: Diagnosis not present

## 2024-06-05 DIAGNOSIS — E7800 Pure hypercholesterolemia, unspecified: Secondary | ICD-10-CM | POA: Diagnosis not present

## 2024-06-05 DIAGNOSIS — I10 Essential (primary) hypertension: Secondary | ICD-10-CM | POA: Diagnosis not present

## 2024-06-05 DIAGNOSIS — Z1339 Encounter for screening examination for other mental health and behavioral disorders: Secondary | ICD-10-CM | POA: Diagnosis not present

## 2024-06-05 DIAGNOSIS — R7301 Impaired fasting glucose: Secondary | ICD-10-CM | POA: Diagnosis not present

## 2024-06-05 DIAGNOSIS — B351 Tinea unguium: Secondary | ICD-10-CM | POA: Diagnosis not present

## 2024-06-05 DIAGNOSIS — Z0000 Encounter for general adult medical examination without abnormal findings: Secondary | ICD-10-CM | POA: Diagnosis not present

## 2024-06-05 DIAGNOSIS — Z1331 Encounter for screening for depression: Secondary | ICD-10-CM | POA: Diagnosis not present

## 2024-06-05 DIAGNOSIS — M8580 Other specified disorders of bone density and structure, unspecified site: Secondary | ICD-10-CM | POA: Diagnosis not present
